# Patient Record
Sex: Female | Born: 1970 | Race: White | Hispanic: No | Smoking: Current every day smoker
Health system: Southern US, Community
[De-identification: ages and names within clinical notes are randomized; demographics above are authoritative.]

## PROBLEM LIST (undated history)

## (undated) HISTORY — PX: TUBAL LIGATION: SHX77

---

## 1998-01-13 ENCOUNTER — Emergency Department (HOSPITAL_COMMUNITY): Admission: EM | Admit: 1998-01-13 | Discharge: 1998-01-13 | Payer: Self-pay | Admitting: Emergency Medicine

## 1998-07-16 ENCOUNTER — Other Ambulatory Visit: Admission: RE | Admit: 1998-07-16 | Discharge: 1998-07-16 | Payer: Self-pay | Admitting: Obstetrics and Gynecology

## 1998-12-12 ENCOUNTER — Other Ambulatory Visit: Admission: RE | Admit: 1998-12-12 | Discharge: 1998-12-12 | Payer: Self-pay | Admitting: Obstetrics and Gynecology

## 1999-08-05 ENCOUNTER — Other Ambulatory Visit: Admission: RE | Admit: 1999-08-05 | Discharge: 1999-08-05 | Payer: Self-pay | Admitting: Obstetrics and Gynecology

## 2000-08-08 ENCOUNTER — Other Ambulatory Visit: Admission: RE | Admit: 2000-08-08 | Discharge: 2000-08-08 | Payer: Self-pay | Admitting: *Deleted

## 2001-07-05 ENCOUNTER — Other Ambulatory Visit: Admission: RE | Admit: 2001-07-05 | Discharge: 2001-07-05 | Payer: Self-pay | Admitting: Obstetrics and Gynecology

## 2002-07-10 ENCOUNTER — Other Ambulatory Visit: Admission: RE | Admit: 2002-07-10 | Discharge: 2002-07-10 | Payer: Self-pay | Admitting: Obstetrics and Gynecology

## 2002-08-28 ENCOUNTER — Encounter: Admission: RE | Admit: 2002-08-28 | Discharge: 2002-08-28 | Payer: Self-pay | Admitting: Family Medicine

## 2002-08-28 ENCOUNTER — Encounter: Payer: Self-pay | Admitting: Family Medicine

## 2003-06-19 ENCOUNTER — Other Ambulatory Visit: Admission: RE | Admit: 2003-06-19 | Discharge: 2003-06-19 | Payer: Self-pay | Admitting: Obstetrics and Gynecology

## 2003-07-03 ENCOUNTER — Encounter: Admission: RE | Admit: 2003-07-03 | Discharge: 2003-07-03 | Payer: Self-pay | Admitting: Obstetrics and Gynecology

## 2007-08-31 ENCOUNTER — Ambulatory Visit (HOSPITAL_COMMUNITY): Admission: RE | Admit: 2007-08-31 | Discharge: 2007-08-31 | Payer: Self-pay | Admitting: Obstetrics and Gynecology

## 2007-08-31 ENCOUNTER — Encounter (INDEPENDENT_AMBULATORY_CARE_PROVIDER_SITE_OTHER): Payer: Self-pay | Admitting: Obstetrics and Gynecology

## 2009-01-08 ENCOUNTER — Encounter (INDEPENDENT_AMBULATORY_CARE_PROVIDER_SITE_OTHER): Payer: Self-pay | Admitting: *Deleted

## 2009-01-23 ENCOUNTER — Ambulatory Visit: Payer: Self-pay | Admitting: Family Medicine

## 2009-01-24 ENCOUNTER — Encounter (INDEPENDENT_AMBULATORY_CARE_PROVIDER_SITE_OTHER): Payer: Self-pay | Admitting: *Deleted

## 2009-10-07 ENCOUNTER — Encounter: Admission: RE | Admit: 2009-10-07 | Discharge: 2009-10-07 | Payer: Self-pay | Admitting: Obstetrics and Gynecology

## 2010-06-07 LAB — CONVERTED CEMR LAB
ALT: 16 units/L (ref 0–35)
AST: 19 units/L (ref 0–37)
Albumin: 4 g/dL (ref 3.5–5.2)
Alkaline Phosphatase: 62 units/L (ref 39–117)
Basophils Relative: 0.6 % (ref 0.0–3.0)
Calcium: 9.3 mg/dL (ref 8.4–10.5)
Eosinophils Relative: 2.8 % (ref 0.0–5.0)
GFR calc non Af Amer: 99.52 mL/min (ref >60)
Glucose, Bld: 85 mg/dL (ref 70–99)
HCT: 42.9 % (ref 36.0–46.0)
HDL: 40.3 mg/dL (ref >39.00)
Hemoglobin: 15.1 g/dL — ABNORMAL HIGH (ref 12.0–15.0)
Lymphocytes Relative: 31 % (ref 12.0–46.0)
Lymphs Abs: 2.9 10*3/uL (ref 0.7–4.0)
Monocytes Relative: 6.6 % (ref 3.0–12.0)
Neutro Abs: 5.3 10*3/uL (ref 1.4–7.7)
Potassium: 3.9 meq/L (ref 3.5–5.1)
RBC: 4.6 M/uL (ref 3.87–5.11)
RDW: 11.7 % (ref 11.5–14.6)
Sodium: 142 meq/L (ref 135–145)
TSH: 0.83 microintl units/mL (ref 0.35–5.50)
Total CHOL/HDL Ratio: 5
Total Protein: 7.4 g/dL (ref 6.0–8.3)
Vitamin B-12: 597 pg/mL (ref 211–911)
WBC: 9.2 10*3/uL (ref 4.5–10.5)

## 2010-08-26 ENCOUNTER — Other Ambulatory Visit: Payer: Self-pay | Admitting: Obstetrics and Gynecology
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
## 2010-09-22 NOTE — Discharge Summary (Signed)
Linda, [name] NO.:  [id number]   MEDICAL RECORD NO.:  [id number]           PATIENT TYPE:   LOCATION:                                 FACILITY:   PHYSICIAN:  Linda [name], M.D.DATE OF BIRTH:  08/12/1970   DATE OF ADMISSION:  DATE OF DISCHARGE:                               DISCHARGE SUMMARY   CHIEF COMPLIANT:  1. Dysmenorrhea.  2. Menometrorrhagia.  3. Desire for tubal sterilization.   HISTORY OF PRESENT ILLNESS:  She is a 40 year old white female G0, P0,  for tubal ligation, NovaSure endometrial ablation, diagnostic  hysteroscopy and laparoscopy.   ALLERGIES:  She has no known allergies.   MEDICATIONS:  None.   FAMILY HISTORY:  Breast cancer and kidney cancer.   SOCIAL HISTORY:  She is nonsmoker and nondrinker.   PAST MEDICAL HISTORY:  __________  remote history of abnormal Pap smear.  She is a half-pack-a-day smoker.   PREGNANCY HISTORY:  Noncontributory.   Previous ultrasound done reveals a normal size uterus and bilateral  normal adnexa.   PHYSICAL EXAMINATION:  GENERAL:  She is a well-developed, well-nourished  white female, in no acute distress.  HEENT:  Normal.  LUNGS:  Clear.  HEART:  Regular rhythm.  ABDOMEN:  Soft and nontender.  PELVIC:  Normal-sized uterus.  No adnexal masses.  EXTREMITIES:  There are no cords.  NEUROLOGIC:  Nonfocal.  SKIN:  Intact.   IMPRESSION:  1. Menometrorrhagia secondary to dysmenorrhea.  2. Desire for elective sterilization.   PLAN:  Proceed with diagnostic laparoscopy, tubal ligation, diagnostic  hysteroscopy, D&C, NovaSure.  Risks of anesthesia, infection, bleeding,  injury to abdominal organs, need for further surgery were discussed  __________ perforation and need for repair noted.  __________ noted.  The patient acknowledges and wishes to proceed.      Linda [name], M.D.  Electronically Signed     RJT/MEDQ  D:  08/30/2007  T:  08/31/2007  Job:  [id number]

## 2011-02-02 LAB — CBC
HCT: 41.2
Hemoglobin: 14.5
MCHC: 35.1
MCV: 93.9
RBC: 4.39
WBC: 11.5 — ABNORMAL HIGH

## 2011-02-03 ENCOUNTER — Other Ambulatory Visit: Payer: Self-pay | Admitting: Obstetrics and Gynecology

## 2011-02-03 DIAGNOSIS — Z1231 Encounter for screening mammogram for malignant neoplasm of breast: Secondary | ICD-10-CM

## 2011-02-15 ENCOUNTER — Ambulatory Visit
Admission: RE | Admit: 2011-02-15 | Discharge: 2011-02-15 | Disposition: A | Discharge status: Home / Self Care | Payer: 59 | Source: Ambulatory Visit | Attending: Obstetrics and Gynecology | Admitting: Obstetrics and Gynecology

## 2011-02-15 DIAGNOSIS — Z1231 Encounter for screening mammogram for malignant neoplasm of breast: Secondary | ICD-10-CM

## 2012-01-19 ENCOUNTER — Other Ambulatory Visit: Payer: Self-pay | Admitting: Obstetrics and Gynecology

## 2012-01-19 DIAGNOSIS — Z1231 Encounter for screening mammogram for malignant neoplasm of breast: Secondary | ICD-10-CM

## 2012-02-21 ENCOUNTER — Ambulatory Visit
Admission: RE | Admit: 2012-02-21 | Discharge: 2012-02-21 | Disposition: A | Discharge status: Home / Self Care | Payer: BC Managed Care – PPO | Source: Ambulatory Visit | Attending: Obstetrics and Gynecology | Admitting: Obstetrics and Gynecology

## 2012-02-21 DIAGNOSIS — Z1231 Encounter for screening mammogram for malignant neoplasm of breast: Secondary | ICD-10-CM

## 2013-01-31 ENCOUNTER — Other Ambulatory Visit: Payer: Self-pay

## 2013-01-31 DIAGNOSIS — Z1231 Encounter for screening mammogram for malignant neoplasm of breast: Secondary | ICD-10-CM

## 2013-03-06 ENCOUNTER — Ambulatory Visit
Admission: RE | Admit: 2013-03-06 | Discharge: 2013-03-06 | Disposition: A | Discharge status: Home / Self Care | Payer: BC Managed Care – PPO | Source: Ambulatory Visit

## 2013-03-06 DIAGNOSIS — Z1231 Encounter for screening mammogram for malignant neoplasm of breast: Secondary | ICD-10-CM

## 2015-02-12 ENCOUNTER — Other Ambulatory Visit: Payer: Self-pay

## 2015-02-12 DIAGNOSIS — Z1231 Encounter for screening mammogram for malignant neoplasm of breast: Secondary | ICD-10-CM

## 2015-02-26 ENCOUNTER — Ambulatory Visit
Admission: RE | Admit: 2015-02-26 | Discharge: 2015-02-26 | Disposition: A | Discharge status: Home / Self Care | Payer: 59 | Source: Ambulatory Visit

## 2015-02-26 DIAGNOSIS — Z1231 Encounter for screening mammogram for malignant neoplasm of breast: Secondary | ICD-10-CM

## 2016-01-26 ENCOUNTER — Other Ambulatory Visit: Payer: Self-pay | Admitting: Obstetrics and Gynecology

## 2016-01-26 DIAGNOSIS — Z1231 Encounter for screening mammogram for malignant neoplasm of breast: Secondary | ICD-10-CM

## 2016-03-01 ENCOUNTER — Ambulatory Visit: Payer: Self-pay

## 2016-03-23 ENCOUNTER — Ambulatory Visit
Admission: RE | Admit: 2016-03-23 | Discharge: 2016-03-23 | Disposition: A | Discharge status: Home / Self Care | Payer: 59 | Source: Ambulatory Visit | Attending: Obstetrics and Gynecology | Admitting: Obstetrics and Gynecology

## 2016-03-23 DIAGNOSIS — Z1231 Encounter for screening mammogram for malignant neoplasm of breast: Secondary | ICD-10-CM

## 2017-02-21 ENCOUNTER — Other Ambulatory Visit: Payer: Self-pay | Admitting: Obstetrics and Gynecology

## 2017-02-21 DIAGNOSIS — Z1231 Encounter for screening mammogram for malignant neoplasm of breast: Secondary | ICD-10-CM

## 2017-03-28 ENCOUNTER — Ambulatory Visit
Admission: RE | Admit: 2017-03-28 | Discharge: 2017-03-28 | Disposition: A | Discharge status: Home / Self Care | Payer: 59 | Source: Ambulatory Visit | Attending: Obstetrics and Gynecology | Admitting: Obstetrics and Gynecology

## 2017-03-28 DIAGNOSIS — Z1231 Encounter for screening mammogram for malignant neoplasm of breast: Secondary | ICD-10-CM

## 2017-03-30 ENCOUNTER — Ambulatory Visit: Payer: 59

## 2017-03-30 ENCOUNTER — Ambulatory Visit
Admission: RE | Admit: 2017-03-30 | Discharge: 2017-03-30 | Disposition: A | Discharge status: Home / Self Care | Payer: 59 | Source: Ambulatory Visit | Attending: Obstetrics and Gynecology | Admitting: Obstetrics and Gynecology

## 2017-03-30 ENCOUNTER — Other Ambulatory Visit: Payer: Self-pay | Admitting: Obstetrics and Gynecology

## 2017-03-30 DIAGNOSIS — R928 Other abnormal and inconclusive findings on diagnostic imaging of breast: Secondary | ICD-10-CM

## 2017-09-16 ENCOUNTER — Ambulatory Visit: Payer: Self-pay | Admitting: Physician Assistant

## 2017-09-21 ENCOUNTER — Encounter: Payer: Self-pay | Admitting: Physician Assistant

## 2017-09-21 ENCOUNTER — Ambulatory Visit: Payer: 59 | Admitting: Physician Assistant

## 2017-09-21 VITALS — BP 122/78 | HR 65 | Temp 98.5°F | Ht 64.0 in | Wt 205.8 lb

## 2017-09-21 DIAGNOSIS — F4310 Post-traumatic stress disorder, unspecified: Secondary | ICD-10-CM | POA: Diagnosis not present

## 2017-09-21 MED ORDER — CLONAZEPAM 0.5 MG PO TABS: 0.5000 mg | ORAL_TABLET | Freq: Every day | ORAL | 0 refills | Status: AC

## 2017-09-21 MED ORDER — ESCITALOPRAM OXALATE 5 MG PO TABS: 5.0000 mg | ORAL_TABLET | Freq: Every day | ORAL | 3 refills | Status: DC
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
## 2017-09-21 NOTE — Patient Instructions (Addendum)
I look forward to seeing you back in about 6 weeks.  If at any point you begin to feel worse please call or come back to the clinic.  If you do take a Klonopin please make sure that you do not drink any alcohol along with that medication and do not operate heavy machinery.    IF you received an x-ray today, you will receive an invoice from [facility]. Please contact [facility] at [phone] with questions or concerns regarding your invoice.   IF you received labwork today, you will receive an invoice from [facility]. Please contact [facility] at [phone] with questions or concerns regarding your invoice.   Our billing staff will not be able to assist you with questions regarding bills from these companies.  You will be contacted with the lab results as soon as they are available. The fastest way to get your results is to activate your My Chart account. Instructions are located on the last page of this paperwork. If you have not heard from [place] regarding the results in 2 weeks, please contact this office.

## 2017-11-02 ENCOUNTER — Ambulatory Visit: Payer: 59 | Admitting: Physician Assistant

## 2017-11-02 DIAGNOSIS — F4310 Post-traumatic stress disorder, unspecified: Secondary | ICD-10-CM | POA: Diagnosis not present

## 2017-11-02 MED ORDER — ESCITALOPRAM OXALATE 5 MG PO TABS: 5.0000 mg | ORAL_TABLET | Freq: Every day | ORAL | 3 refills | Status: AC

## 2017-11-02 NOTE — Patient Instructions (Addendum)
  If you desire to stop the medication after 6 months, then please do so by taking 1/2 tab for one week, then stop.  You can start back on the medication if your symptoms return.   Come back in 6 months to see me.    IF you received an x-ray today, you will receive an invoice from [facility]. Please contact [facility] at [phone] with questions or concerns regarding your invoice.   IF you received labwork today, you will receive an invoice from [facility]. Please contact [facility] at [phone] with questions or concerns regarding your invoice.   Our billing staff will not be able to assist you with questions regarding bills from these companies.  You will be contacted with the lab results as soon as they are available. The fastest way to get your results is to activate your My Chart account. Instructions are located on the last page of this paperwork. If you have not heard from [place] regarding the results in 2 weeks, please contact this office.
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
## 2018-03-06 ENCOUNTER — Other Ambulatory Visit: Payer: Self-pay | Admitting: Obstetrics and Gynecology

## 2018-03-06 DIAGNOSIS — Z1231 Encounter for screening mammogram for malignant neoplasm of breast: Secondary | ICD-10-CM

## 2018-04-14 ENCOUNTER — Ambulatory Visit: Payer: 59

## 2018-05-19 ENCOUNTER — Ambulatory Visit (INDEPENDENT_AMBULATORY_CARE_PROVIDER_SITE_OTHER): Payer: 59 | Admitting: Podiatry

## 2018-05-19 VITALS — BP 133/80 | HR 63

## 2018-05-19 DIAGNOSIS — L603 Nail dystrophy: Secondary | ICD-10-CM | POA: Diagnosis not present

## 2018-05-19 DIAGNOSIS — M79676 Pain in unspecified toe(s): Secondary | ICD-10-CM | POA: Diagnosis not present

## 2018-05-21 NOTE — Progress Notes (Signed)
  Subjective:  Patient ID: Linda Stone, female    DOB: 07/08/1970,  MRN: [id number]  Chief Complaint  Patient presents with  . Nail Problem    Right 1st toenial injured in fall 41mo ago. Pt states no pain until 1 week ago when it became very painful. Pt denies fever/vomiting/nausea/chills.    48 y.o. female presents with the above complaint.   Review of Systems: Negative except as noted in the HPI. Denies N/V/F/Ch.  No past medical history on file.  Current Outpatient Medications:  .  clonazePAM (KLONOPIN) 0.5 MG tablet, Take 1-2 tablets (0.5-1 mg total) by mouth daily. May cause sleepiness. Do not operate heavy machinery while taking this., Disp: 20 tablet, Rfl: 0 .  escitalopram (LEXAPRO) 5 MG tablet, Take 1 tablet (5 mg total) by mouth at bedtime., Disp: 90 tablet, Rfl: 3 .  valACYclovir (VALTREX) 500 MG tablet, Take 500 mg by mouth as needed., Disp: , Rfl:   Social History   Tobacco Use  Smoking Status Current Every Day Smoker  . Packs/day: 1.00  . Years: 30.00  . Pack years: 30.00  . Types: Cigarettes  Smokeless Tobacco Never Used    No Known Allergies Objective:   Vitals:   05/19/18 1149  BP: 133/80  Pulse: 63   There is no height or weight on file to calculate BMI. Constitutional Well developed. Well nourished.  Vascular Dorsalis pedis pulses palpable bilaterally. Posterior tibial pulses palpable bilaterally. Capillary refill normal to all digits.  No cyanosis or clubbing noted. Pedal hair growth normal.  Neurologic Normal speech. Oriented to person, place, and time. Epicritic sensation to light touch grossly present bilaterally.  Dermatologic Nails right 1st nail thickened, loose Skin intact  Orthopedic: Normal joint ROM without pain or crepitus bilaterally. No visible deformities. No bony tenderness.   Radiographs: None Assessment:   1. Nail dystrophy   2. Pain around toenail    Plan:  Patient was evaluated and treated and all questions  answered.  Onycholyisis R 1st Toenail -No debrided with nail nipper no need for full avulsion at this time discussed continued soaking after removal and that the nail would likely grow and without issues but could grow deformed due to the injury  No follow-ups on file.

## 2018-05-24 ENCOUNTER — Ambulatory Visit
Admission: RE | Admit: 2018-05-24 | Discharge: 2018-05-24 | Disposition: A | Discharge status: Home / Self Care | Payer: 59 | Source: Ambulatory Visit | Attending: Obstetrics and Gynecology | Admitting: Obstetrics and Gynecology

## 2018-05-24 DIAGNOSIS — Z1231 Encounter for screening mammogram for malignant neoplasm of breast: Secondary | ICD-10-CM

## 2019-05-01 ENCOUNTER — Other Ambulatory Visit: Payer: Self-pay | Admitting: Obstetrics and Gynecology

## 2019-05-01 DIAGNOSIS — Z1231 Encounter for screening mammogram for malignant neoplasm of breast: Secondary | ICD-10-CM

## 2019-06-15 ENCOUNTER — Other Ambulatory Visit: Payer: Self-pay

## 2019-06-15 ENCOUNTER — Ambulatory Visit
Admission: RE | Admit: 2019-06-15 | Discharge: 2019-06-15 | Disposition: A | Discharge status: Home / Self Care | Payer: 59 | Source: Ambulatory Visit | Attending: Obstetrics and Gynecology | Admitting: Obstetrics and Gynecology

## 2019-06-15 DIAGNOSIS — Z1231 Encounter for screening mammogram for malignant neoplasm of breast: Secondary | ICD-10-CM

## 2020-06-10 ENCOUNTER — Ambulatory Visit (INDEPENDENT_AMBULATORY_CARE_PROVIDER_SITE_OTHER): Payer: 59 | Admitting: Physician Assistant

## 2020-06-10 ENCOUNTER — Encounter: Payer: Self-pay | Admitting: Physician Assistant

## 2020-06-10 ENCOUNTER — Other Ambulatory Visit: Payer: Self-pay

## 2020-06-10 DIAGNOSIS — L821 Other seborrheic keratosis: Secondary | ICD-10-CM

## 2020-06-10 DIAGNOSIS — L820 Inflamed seborrheic keratosis: Secondary | ICD-10-CM | POA: Diagnosis not present

## 2020-06-10 DIAGNOSIS — D1800 Hemangioma unspecified site: Secondary | ICD-10-CM

## 2020-06-10 DIAGNOSIS — L578 Other skin changes due to chronic exposure to nonionizing radiation: Secondary | ICD-10-CM

## 2020-06-10 DIAGNOSIS — L814 Other melanin hyperpigmentation: Secondary | ICD-10-CM

## 2020-06-10 DIAGNOSIS — Z1283 Encounter for screening for malignant neoplasm of skin: Secondary | ICD-10-CM | POA: Diagnosis not present

## 2020-06-19 ENCOUNTER — Encounter: Payer: Self-pay | Admitting: Physician Assistant

## 2020-06-19 NOTE — Progress Notes (Signed)
   Follow-Up Visit   Subjective  Linda Stone is a 50 y.o. female who presents for the following: Annual Exam (RIGHT BACK MOLE, ONE [place]).   The following portions of the chart were reviewed this encounter and updated as appropriate:      Objective  Well appearing patient in no apparent distress; mood and affect are within normal limits.  A full examination was performed including scalp, head, eyes, ears, nose, lips, neck, chest, axillae, abdomen, back, buttocks, bilateral upper extremities, bilateral lower extremities, hands, feet, fingers, toes, fingernails, and toenails. All findings within normal limits unless otherwise noted below.  Objective  head to toe: Waist up skin examination- no atypical moles or non mole skin cancer  Objective  Left Upper Back, Right Lower Back: Erythematous stuck-on, waxy papule or plaque.    Assessment & Plan  Encounter for screening for malignant neoplasm of skin head to toe  Yearly skin check  Seborrheic keratosis, inflamed (2) Left Upper Back; Right Lower Back  Destruction of lesion - Left Upper Back, Right Lower Back Complexity: simple   Destruction method: cryotherapy   Informed consent: discussed and consent obtained   Timeout:  patient name, date of birth, surgical site, and procedure verified Lesion destroyed using liquid nitrogen: Yes   Cryotherapy cycles:  3 Outcome: patient tolerated procedure well with no complications    Lentigines - Scattered tan macules - Discussed due to sun exposure - Benign, observe - Call for any changes  Seborrheic Keratoses - Stuck-on, waxy, tan-brown papules and plaques  - Discussed benign etiology and prognosis. - Observe - Call for any changes  Hemangiomas - Red papules - Discussed benign nature - Observe - Call for any changes  Actinic Damage - diffuse scaly erythematous macules with underlying dyspigmentation - Recommend daily broad spectrum sunscreen SPF 30+  to sun-exposed areas, reapply every 2 hours as needed.  - Call for new or changing lesions.   I, SHEFFIELD,KELLI, PA-C, have reviewed all documentation's for this visit.  The documentation on 06/19/20 for the exam, diagnosis, procedures and orders are all accurate and complete.

## 2020-06-24 ENCOUNTER — Other Ambulatory Visit: Payer: Self-pay | Admitting: Obstetrics and Gynecology

## 2020-06-24 DIAGNOSIS — Z1231 Encounter for screening mammogram for malignant neoplasm of breast: Secondary | ICD-10-CM

## 2020-07-10 ENCOUNTER — Other Ambulatory Visit: Payer: Self-pay

## 2020-07-10 ENCOUNTER — Ambulatory Visit
Admission: RE | Admit: 2020-07-10 | Discharge: 2020-07-10 | Disposition: A | Discharge status: Home / Self Care | Payer: 59 | Source: Ambulatory Visit | Attending: Obstetrics and Gynecology | Admitting: Obstetrics and Gynecology

## 2020-07-10 DIAGNOSIS — Z1231 Encounter for screening mammogram for malignant neoplasm of breast: Secondary | ICD-10-CM

## 2020-08-13 ENCOUNTER — Ambulatory Visit: Payer: 59

## 2021-04-27 ENCOUNTER — Other Ambulatory Visit: Payer: Self-pay | Admitting: Obstetrics and Gynecology

## 2021-04-27 DIAGNOSIS — Z1231 Encounter for screening mammogram for malignant neoplasm of breast: Secondary | ICD-10-CM

## 2021-05-21 ENCOUNTER — Ambulatory Visit
Admission: RE | Admit: 2021-05-21 | Discharge: 2021-05-21 | Disposition: A | Discharge status: Home / Self Care | Payer: 59 | Source: Ambulatory Visit | Attending: Obstetrics and Gynecology | Admitting: Obstetrics and Gynecology

## 2021-05-21 DIAGNOSIS — Z1231 Encounter for screening mammogram for malignant neoplasm of breast: Secondary | ICD-10-CM

## 2021-06-10 HISTORY — PX: REDUCTION MAMMAPLASTY: SUR839

## 2021-06-23 ENCOUNTER — Ambulatory Visit: Payer: 59

## 2021-09-02 ENCOUNTER — Encounter: Payer: Self-pay | Admitting: Physician Assistant

## 2021-09-02 ENCOUNTER — Ambulatory Visit (INDEPENDENT_AMBULATORY_CARE_PROVIDER_SITE_OTHER): Payer: 59 | Admitting: Physician Assistant

## 2021-09-02 DIAGNOSIS — Z1283 Encounter for screening for malignant neoplasm of skin: Secondary | ICD-10-CM

## 2021-09-02 DIAGNOSIS — D225 Melanocytic nevi of trunk: Secondary | ICD-10-CM

## 2021-09-02 DIAGNOSIS — D485 Neoplasm of uncertain behavior of skin: Secondary | ICD-10-CM

## 2021-09-02 DIAGNOSIS — D229 Melanocytic nevi, unspecified: Secondary | ICD-10-CM

## 2021-09-02 HISTORY — DX: Melanocytic nevi, unspecified: D22.9

## 2021-09-02 NOTE — Progress Notes (Signed)
? ?  Follow-Up Visit ?  ?Subjective  ?Linda Stone is a 51 y.o. female who presents for the following: Annual Exam (Check mole on the back feels harder to touch, been there x years ). ? ? ?The following portions of the chart were reviewed this encounter and updated as appropriate:  Tobacco  Allergies  Meds  Problems  Med Hx  Surg Hx  Fam Hx   ?  ? ?Objective  ?Well appearing patient in no apparent distress; mood and affect are within normal limits. ? ?A full examination was performed including scalp, head, eyes, ears, nose, lips, neck, chest, axillae, abdomen, back, buttocks, bilateral upper extremities, bilateral lower extremities, hands, feet, fingers, toes, fingernails, and toenails. All findings within normal limits unless otherwise noted below. ? ?No signs of NMSC noted at the time of the visit.  ? ?Left Lower Back ?Bichromic dark nested macule.  ? ? ? ? ? ? ? ?Assessment & Plan  ?Screening exam for skin cancer ? ?Yearly skin exam.  ? ?Neoplasm of uncertain behavior of skin ?Left Lower Back ? ?Skin / nail biopsy ?Type of biopsy: tangential   ?Informed consent: discussed and consent obtained   ?Timeout: patient name, date of birth, surgical site, and procedure verified   ?Anesthesia: the lesion was anesthetized in a standard fashion   ?Anesthetic:  1% lidocaine w/ epinephrine 1-100,000 local infiltration ?Instrument used: flexible razor blade   ?Hemostasis achieved with: aluminum chloride and electrodesiccation   ?Outcome: patient tolerated procedure well   ?Post-procedure details: wound care instructions given   ? ?Specimen 1 - Surgical pathology ?Differential Diagnosis: atypia ? ?Check Margins: yes  ? ? ? ?I, Dakota Stangl, PA-C, have reviewed all documentation's for this visit.  The documentation on 09/02/21 for the exam, diagnosis, procedures and orders are all accurate and complete. ?

## 2021-09-02 NOTE — Patient Instructions (Signed)

## 2021-09-09 ENCOUNTER — Telehealth: Payer: Self-pay

## 2021-09-09 NOTE — Telephone Encounter (Signed)
Path to patient w/s made 6/14 ?

## 2021-09-09 NOTE — Telephone Encounter (Signed)
-----   Message from Warren Danes, [place] sent at 09/09/2021  8:21 AM EDT ----- ?WS ?

## 2021-09-28 ENCOUNTER — Encounter: Payer: Self-pay | Admitting: Physician Assistant

## 2021-09-28 ENCOUNTER — Ambulatory Visit (INDEPENDENT_AMBULATORY_CARE_PROVIDER_SITE_OTHER): Payer: 59 | Admitting: Physician Assistant

## 2021-09-28 DIAGNOSIS — D229 Melanocytic nevi, unspecified: Secondary | ICD-10-CM

## 2021-09-28 DIAGNOSIS — D225 Melanocytic nevi of trunk: Secondary | ICD-10-CM | POA: Diagnosis not present

## 2021-09-28 MED ORDER — MUPIROCIN 2 % EX OINT: 1.0000 "application " | TOPICAL_OINTMENT | Freq: Two times a day (BID) | CUTANEOUS | 0 refills | Status: AC

## 2021-09-28 NOTE — Patient Instructions (Signed)

## 2021-09-28 NOTE — Progress Notes (Signed)
   Follow-Up Visit   Subjective  Linda Stone is a 51 y.o. female who presents for the following: Procedure (Patient her today for wider shave on left lower back. ).   The following portions of the chart were reviewed this encounter and updated as appropriate:  Tobacco  Allergies  Meds  Problems  Med Hx  Surg Hx  Fam Hx      Objective  Well appearing patient in no apparent distress; mood and affect are within normal limits.  All skin waist up examined.  Left Lower Back Brown macule   Assessment & Plan  Atypical mole Left Lower Back  Epidermal / dermal shaving - Left Lower Back  Lesion diameter (cm):  1.6 Informed consent: discussed and consent obtained   Timeout: patient name, date of birth, surgical site, and procedure verified   Procedure prep:  Patient was prepped and draped in usual sterile fashion Prep type:  Chlorhexidine Anesthesia: the lesion was anesthetized in a standard fashion   Anesthetic:  1% lidocaine w/ epinephrine 1-100,000 local infiltration Instrument used: DermaBlade and flexible razor blade   Hemostasis achieved with: aluminum chloride and ferric subsulfate   Outcome: patient tolerated procedure well   Post-procedure details: sterile dressing applied and wound care instructions given   Dressing type: petrolatum gauze, petrolatum and bandage    mupirocin ointment (BACTROBAN) 2 % - Left Lower Back Apply 1 application. topically 2 (two) times daily.  Specimen 1 - Surgical pathology Differential Diagnosis: R/O Atypia  Check Margins: Yes  [id number]    I, Mikaelyn Arthurs, PA-C, have reviewed all documentation's for this visit.  The documentation on 09/28/21 for the exam, diagnosis, procedures and orders are all accurate and complete.

## 2021-10-06 ENCOUNTER — Telehealth: Payer: Self-pay

## 2021-10-06 NOTE — Telephone Encounter (Signed)
-----   Message from Warren Danes, [place] sent at 10/06/2021 10:50 AM EDT ----- Margins clear- recheck 6 months

## 2021-10-06 NOTE — Telephone Encounter (Signed)
Phone call to patient with her pathology results. Voicemail left for patient to give the office a call back.  

## 2021-10-06 NOTE — Telephone Encounter (Signed)
Phone call from patient returning my call. Pathology results given to patient.

## 2021-10-21 ENCOUNTER — Encounter: Payer: 59 | Admitting: Physician Assistant

## 2022-03-16 ENCOUNTER — Ambulatory Visit: Payer: 59 | Admitting: Physician Assistant

## 2022-06-07 ENCOUNTER — Other Ambulatory Visit: Payer: Self-pay | Admitting: Obstetrics and Gynecology

## 2022-06-07 DIAGNOSIS — Z1231 Encounter for screening mammogram for malignant neoplasm of breast: Secondary | ICD-10-CM

## 2022-07-28 ENCOUNTER — Ambulatory Visit
Admission: RE | Admit: 2022-07-28 | Discharge: 2022-07-28 | Disposition: A | Discharge status: Home / Self Care | Payer: BLUE CROSS/BLUE SHIELD | Source: Ambulatory Visit | Attending: Obstetrics and Gynecology | Admitting: Obstetrics and Gynecology

## 2022-07-28 DIAGNOSIS — Z1231 Encounter for screening mammogram for malignant neoplasm of breast: Secondary | ICD-10-CM

## 2022-09-14 ENCOUNTER — Ambulatory Visit: Payer: 59 | Admitting: Physician Assistant

## 2023-05-28 IMAGING — MG MM DIGITAL SCREENING BILAT W/ TOMO AND CAD
8 series · 8 of 24 positions shown · non-contrast
Comparison: Previous exam(s).

CLINICAL DATA: Screening.

EXAM:
DIGITAL SCREENING BILATERAL MAMMOGRAM WITH TOMOSYNTHESIS AND CAD
TECHNIQUE: Bilateral screening digital craniocaudal and mediolateral oblique
mammograms were obtained. Bilateral screening digital breast
tomosynthesis was performed. The images were evaluated with
computer-aided detection.

[L CC synth-2D]
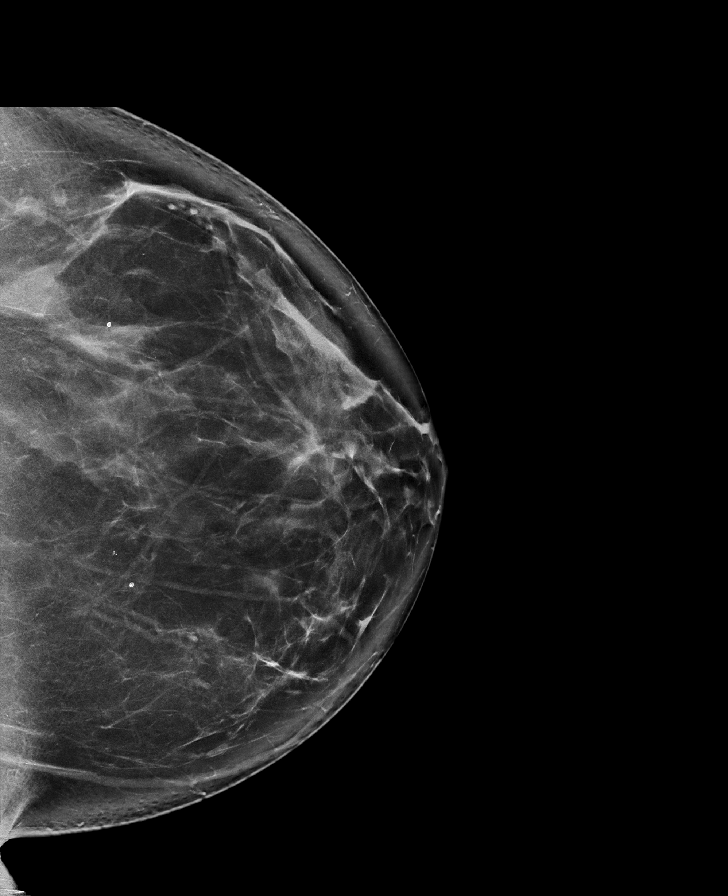

[R CC synth-2D]
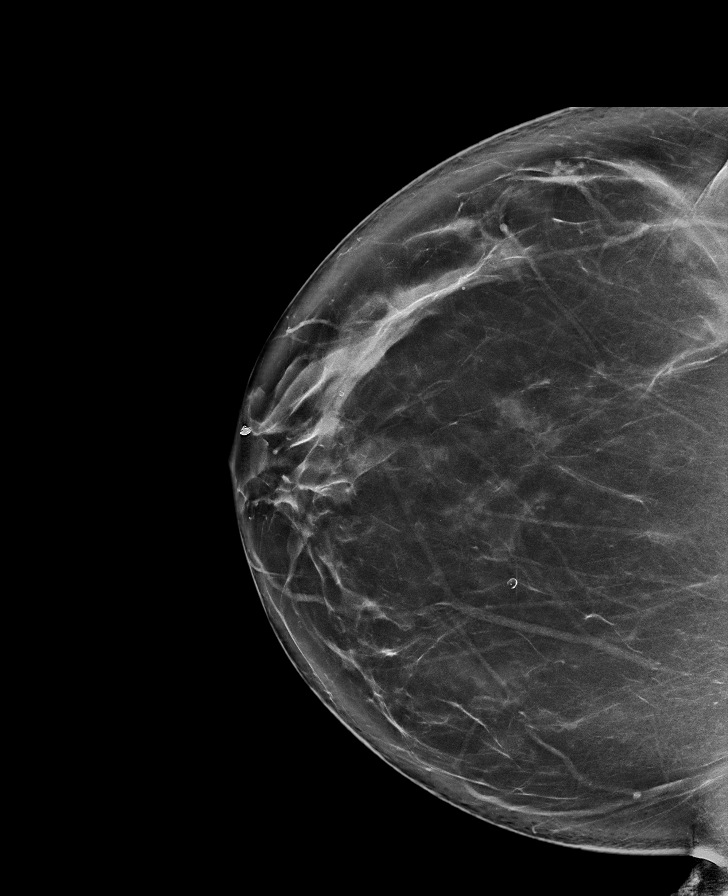

[L MLO synth-2D]
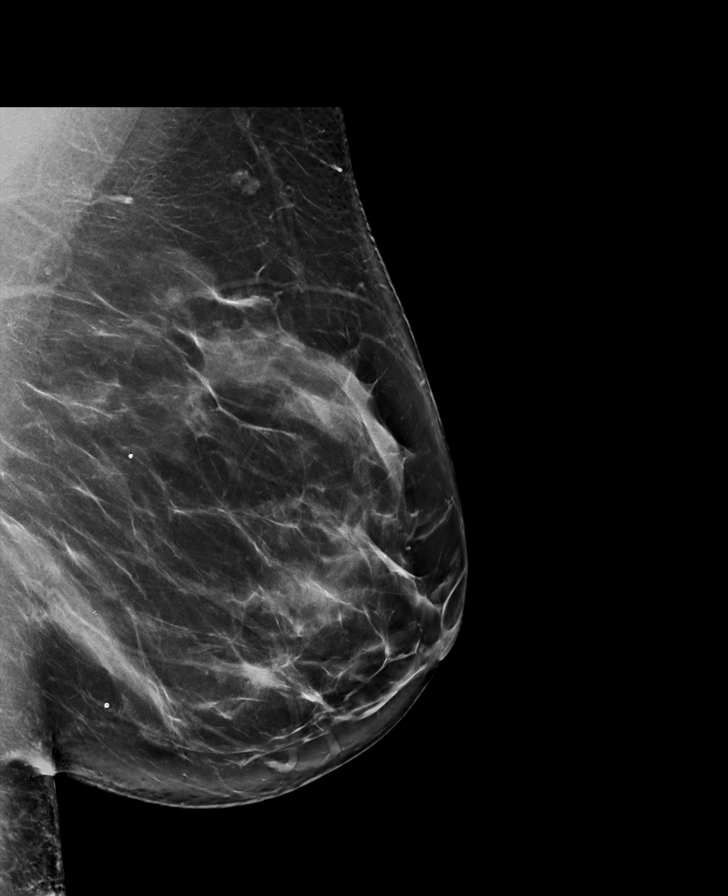

[R MLO synth-2D]
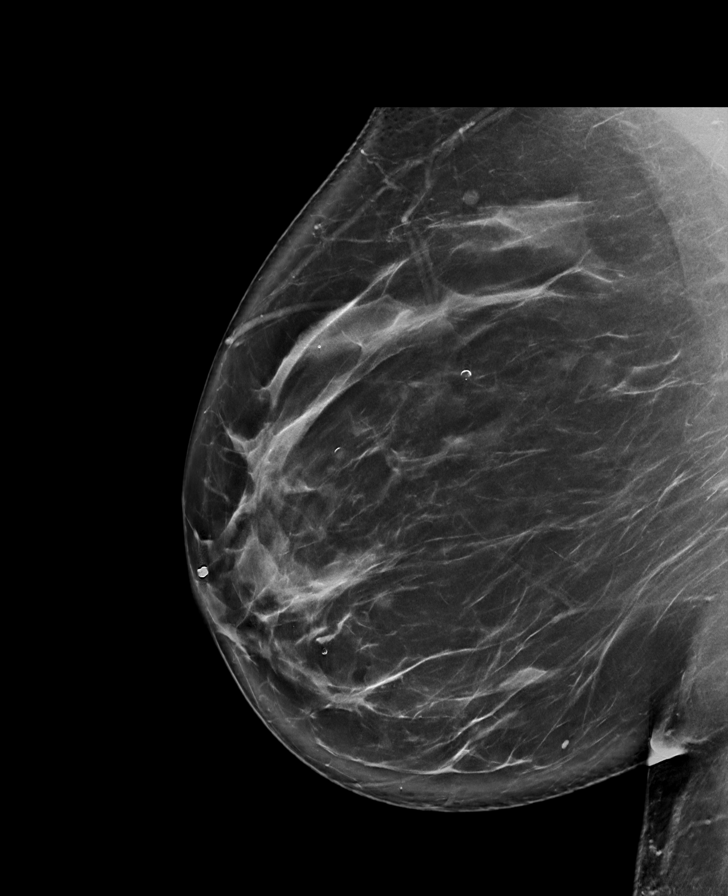

[R MLO tomo · tomo slice 53/104.0]
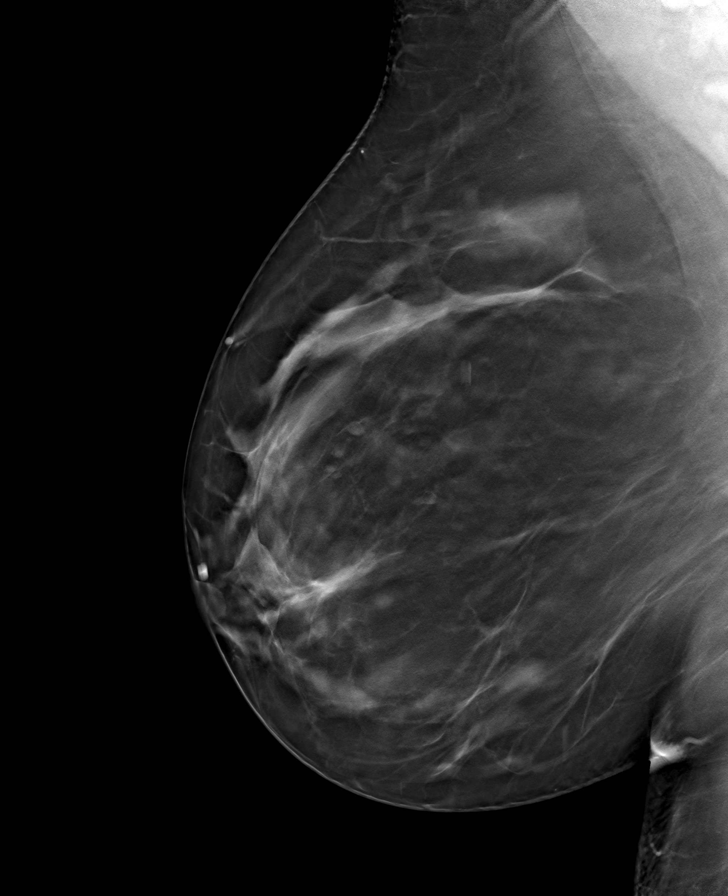

[L MLO tomo · tomo slice 50/99.0]
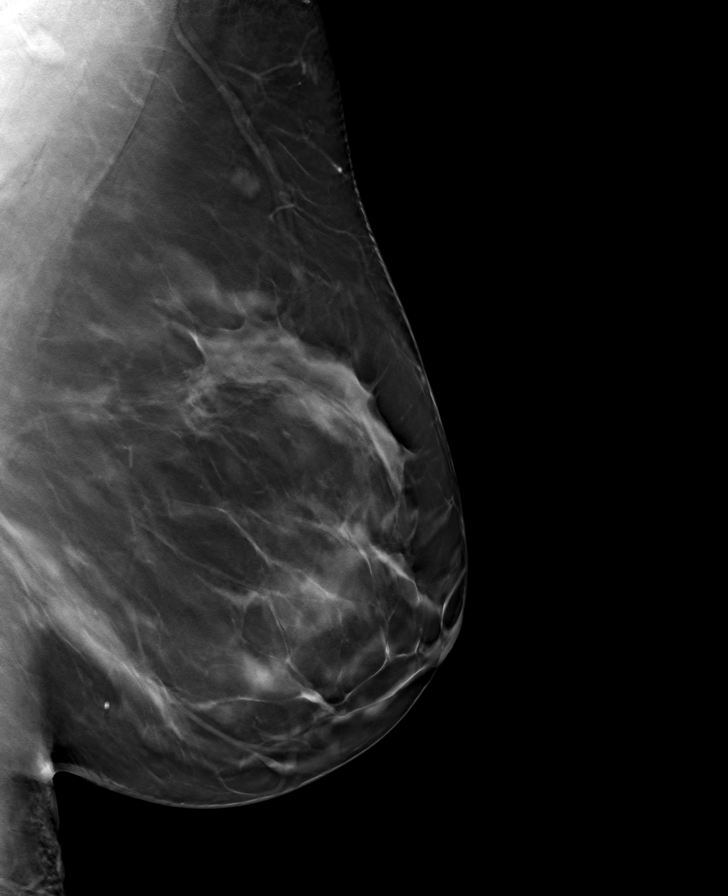

[L CC tomo · tomo slice 49/97.0]
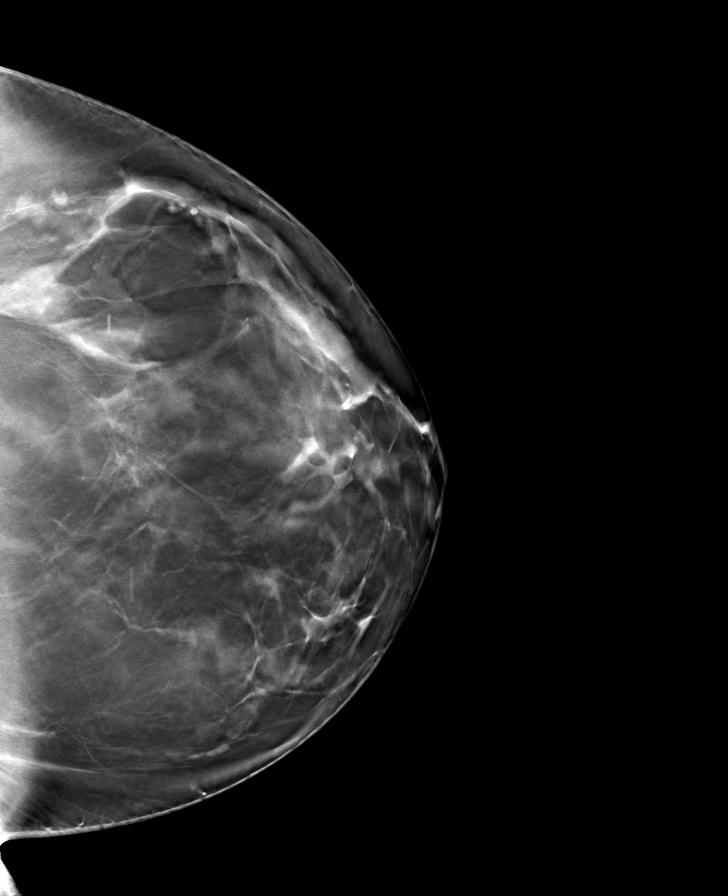

[R CC tomo · tomo slice 50/99.0]
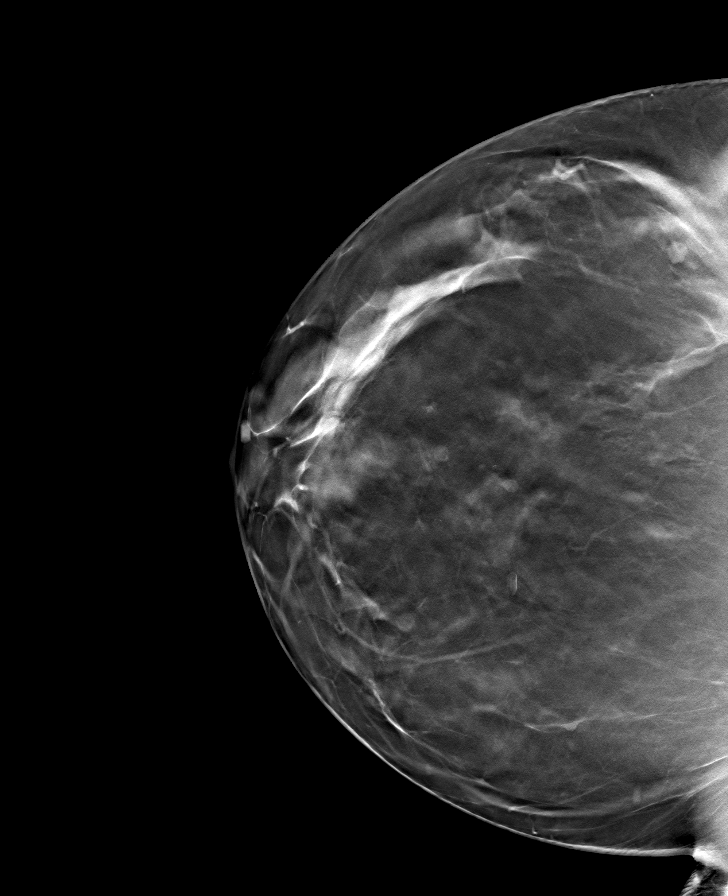

[8 of 24 positions shown; findings below may reference images not displayed]

ACR Breast Density Category c: The breast tissue is heterogeneously
dense, which may obscure small masses.
FINDINGS: There are no findings suspicious for malignancy.
IMPRESSION: No mammographic evidence of malignancy. A result letter of this
screening mammogram will be mailed directly to the patient.

RECOMMENDATION:
Screening mammogram in one year. (Code:Q3-W-BC3)

BI-RADS CATEGORY  1: Negative.
